# Patient Record
Sex: Male | Born: 1985 | Race: Black or African American
Health system: Northeastern US, Academic
[De-identification: ages and names within clinical notes are randomized; demographics above are authoritative.]

## PROBLEM LIST (undated history)

## (undated) DIAGNOSIS — J45909 Unspecified asthma, uncomplicated: Secondary | ICD-10-CM

## (undated) DIAGNOSIS — E785 Hyperlipidemia, unspecified: Secondary | ICD-10-CM

## (undated) DIAGNOSIS — E7800 Pure hypercholesterolemia, unspecified: Secondary | ICD-10-CM

## (undated) DIAGNOSIS — J069 Acute upper respiratory infection, unspecified: Secondary | ICD-10-CM

## (undated) DIAGNOSIS — K625 Hemorrhage of anus and rectum: Secondary | ICD-10-CM

## (undated) DIAGNOSIS — F419 Anxiety disorder, unspecified: Secondary | ICD-10-CM

## (undated) HISTORY — DX: Unspecified asthma, uncomplicated: J45.909

## (undated) HISTORY — DX: Hyperlipidemia, unspecified: E78.5

## (undated) HISTORY — PX: VASECTOMY: SHX75

## (undated) HISTORY — DX: Acute upper respiratory infection, unspecified: J06.9

## (undated) HISTORY — DX: Hemorrhage of anus and rectum: K62.5

## (undated) HISTORY — PX: WISDOM TOOTH EXTRACTION: SHX21

---

## 2011-06-13 ENCOUNTER — Ambulatory Visit
Admit: 2011-06-13 | Discharge: 2011-06-13 | Disposition: A | Discharge status: Home / Self Care | Payer: Self-pay | Source: Ambulatory Visit | Attending: Emergency Medicine | Admitting: Emergency Medicine

## 2011-08-04 ENCOUNTER — Ambulatory Visit
Admit: 2011-08-04 | Discharge: 2011-08-04 | Disposition: A | Discharge status: Home / Self Care | Payer: Self-pay | Source: Ambulatory Visit | Admitting: Emergency Medicine

## 2011-08-13 ENCOUNTER — Ambulatory Visit
Admit: 2011-08-13 | Discharge: 2011-08-13 | Disposition: A | Discharge status: Home / Self Care | Payer: Self-pay | Source: Ambulatory Visit | Admitting: Emergency Medicine

## 2011-09-19 ENCOUNTER — Ambulatory Visit
Admit: 2011-09-19 | Discharge: 2011-09-19 | Disposition: A | Discharge status: Home / Self Care | Payer: Self-pay | Source: Ambulatory Visit | Admitting: Emergency Medicine

## 2011-09-24 ENCOUNTER — Ambulatory Visit
Admit: 2011-09-24 | Discharge: 2011-09-24 | Disposition: A | Discharge status: Home / Self Care | Payer: Self-pay | Source: Ambulatory Visit | Admitting: Emergency Medicine

## 2012-03-10 ENCOUNTER — Ambulatory Visit
Admit: 2012-03-10 | Discharge: 2012-03-10 | Disposition: A | Discharge status: Home / Self Care | Payer: Self-pay | Source: Ambulatory Visit | Attending: Emergency Medicine | Admitting: Emergency Medicine

## 2012-04-14 ENCOUNTER — Ambulatory Visit
Admit: 2012-04-14 | Discharge: 2012-04-14 | Disposition: A | Discharge status: Home / Self Care | Payer: Self-pay | Source: Ambulatory Visit | Attending: Emergency Medicine | Admitting: Emergency Medicine

## 2015-03-10 ENCOUNTER — Encounter: Payer: Self-pay | Admitting: Emergency Medicine

## 2015-03-10 ENCOUNTER — Inpatient Hospital Stay
Admission: EM | Admit: 2015-03-10 | Disposition: A | Discharge status: Home / Self Care | Payer: Self-pay | Source: Ambulatory Visit | Attending: Orthopedic Surgery | Admitting: Orthopedic Surgery

## 2015-03-10 DIAGNOSIS — W3400XA Accidental discharge from unspecified firearms or gun, initial encounter: Secondary | ICD-10-CM

## 2015-03-10 DIAGNOSIS — T1490XA Injury, unspecified, initial encounter: Principal | ICD-10-CM

## 2015-03-10 LAB — PLASMA PROF 7 (ED ONLY)
Anion Gap,PL: 16 (ref 7–16)
CO2,Plasma: 21 mmol/L (ref 20–28)
Chloride,Plasma: 102 mmol/L (ref 96–108)
Creatinine: 0.87 mg/dL (ref 0.67–1.17)
GFR,Black: 134 *
GFR,Caucasian: 116 *
Glucose,Plasma: 109 mg/dL — ABNORMAL HIGH (ref 60–99)
Potassium,Plasma: 3.8 mmol/L (ref 3.4–4.7)
Sodium,Plasma: 139 mmol/L (ref 133–145)
UN,Plasma: 15 mg/dL (ref 6–20)

## 2015-03-10 LAB — CBC AND DIFFERENTIAL
Baso # K/uL: 0 10*3/uL (ref 0.0–0.1)
Basophil %: 0.5 %
Eos # K/uL: 0.2 10*3/uL (ref 0.0–0.5)
Eosinophil %: 2.2 %
Hematocrit: 36 % — ABNORMAL LOW (ref 40–51)
Hemoglobin: 12.4 g/dL — ABNORMAL LOW (ref 13.7–17.5)
IMM Granulocytes #: 0 10*3/uL (ref 0.0–0.1)
IMM Granulocytes: 0.3 %
Lymph # K/uL: 4.6 10*3/uL — ABNORMAL HIGH (ref 1.3–3.6)
Lymphocyte %: 52.2 %
MCH: 32 pg/cell (ref 26–32)
MCHC: 35 g/dL (ref 32–37)
MCV: 92 fL (ref 79–92)
Mono # K/uL: 0.5 10*3/uL (ref 0.3–0.8)
Monocyte %: 5.6 %
Neut # K/uL: 3.5 10*3/uL (ref 1.8–5.4)
Nucl RBC # K/uL: 0 10*3/uL (ref 0.0–0.0)
Nucl RBC %: 0 /100 WBC (ref 0.0–0.2)
Platelets: 237 10*3/uL (ref 150–330)
RBC: 3.9 MIL/uL — ABNORMAL LOW (ref 4.6–6.1)
RDW: 12.6 % (ref 11.6–14.4)
Seg Neut %: 39.2 %
WBC: 8.8 10*3/uL (ref 4.2–9.1)

## 2015-03-10 LAB — PROTIME-INR
INR: 0.9 — ABNORMAL LOW (ref 1.0–1.2)
Protime: 10.6 s (ref 9.2–12.3)

## 2015-03-10 LAB — RUQ PANEL (ED ONLY)
ALT: 40 U/L (ref 0–50)
AST: 33 U/L (ref 0–50)
Albumin: 4.6 g/dL (ref 3.5–5.2)
Alk Phos: 72 U/L (ref 40–130)
Amylase: 49 U/L (ref 28–100)
Bilirubin,Direct: <0.2 mg/dL (ref 0.0–0.3)
Bilirubin,Total: 0.3 mg/dL (ref 0.0–1.2)
Lipase: 15 U/L (ref 13–60)
Total Protein: 7.3 g/dL (ref 6.3–7.7)

## 2015-03-10 LAB — TEG THROMBELASTOGRAPH
A Angle: 73.1 deg — ABNORMAL HIGH (ref 53.0–73.0)
Coagulation Index: 2.1 (ref <=3.0)
K Time: 1.2 min (ref 1.0–3.0)
LY30: 3.4 % (ref <=7.5)
Maximum Amplitude: 57.4 mm (ref 50.0–72.0)
R Time: 3.4 min — ABNORMAL LOW (ref 4.0–10.0)

## 2015-03-10 LAB — APTT: aPTT: 29 s (ref 25.8–37.9)

## 2015-03-10 LAB — DATE/TIME NOT PROVIDED

## 2015-03-10 LAB — TYPE AND SCREEN
ABO RH Blood Type: O POS
Antibody Screen: NEGATIVE

## 2015-03-10 LAB — ETHANOL: Ethanol: <10 mg/dL

## 2015-03-10 LAB — LACTATE, ARTERIAL, WHOLE BLOOD: Lactate ART,WB: 2.9 mmol/L — ABNORMAL HIGH (ref 0.3–0.8)

## 2015-03-10 LAB — NEUTROPHIL #-INSTRUMENT: Neutrophil #-Instrument: 3.5 10*3/uL

## 2015-03-10 LAB — HOLD GRAY

## 2015-03-10 MED ORDER — CAMPHOR-MENTHOL 0.5-0.5 % EX LOTN *I*
TOPICAL_LOTION | CUTANEOUS | Status: DC | PRN
Start: 2015-03-10 — End: 2015-03-13

## 2015-03-10 MED ORDER — OXYCODONE HCL 5 MG PO TABS *I*
5.0000 mg | ORAL_TABLET | ORAL | Status: DC | PRN
Start: 2015-03-10 — End: 2015-03-13
  Administered 2015-03-12 – 2015-03-13 (×4): 5 mg via ORAL
  Filled 2015-03-10 (×5): qty 1

## 2015-03-10 MED ORDER — CALCIUM CARBONATE ANTACID 500 MG PO CHEW *I*
1000.0000 mg | CHEWABLE_TABLET | Freq: Three times a day (TID) | ORAL | Status: DC | PRN
Start: 2015-03-10 — End: 2015-03-13

## 2015-03-10 MED ORDER — TETANUS-DIPHTH-ACELL PERT 5-2.5-18.5 LF-MCG/0.5 IM SUSP *WRAPPED*
INTRAMUSCULAR | Status: DC
Start: 2015-03-10 — End: 2015-03-10
  Filled 2015-03-10: qty 0.5

## 2015-03-10 MED ORDER — SODIUM CHLORIDE 0.9 % IV BOLUS *I*
Status: AC | PRN
Start: 2015-03-10 — End: 2015-03-10
  Administered 2015-03-10: 1000 mL via INTRAVENOUS

## 2015-03-10 MED ORDER — TETANUS-DIPHTH-ACELL PERT 5-2.5-18.5 LF-MCG/0.5 IM SUSP *WRAPPED*
0.5000 mL | Freq: Once | INTRAMUSCULAR | Status: AC
Start: 2015-03-10 — End: 2015-03-10
  Administered 2015-03-10: 0.5 mL via INTRAMUSCULAR

## 2015-03-10 MED ORDER — OXYCODONE HCL 5 MG PO TABS *I*
10.0000 mg | ORAL_TABLET | ORAL | Status: DC | PRN
Start: 2015-03-10 — End: 2015-03-13
  Administered 2015-03-12: 10 mg via ORAL
  Filled 2015-03-10: qty 2

## 2015-03-10 MED ORDER — CIPROFLOXACIN HCL 500 MG PO TABS *I*
500.0000 mg | ORAL_TABLET | Freq: Two times a day (BID) | ORAL | Status: DC
Start: 2015-03-10 — End: 2015-03-12
  Administered 2015-03-11 (×3): 500 mg via ORAL
  Filled 2015-03-10 (×5): qty 1

## 2015-03-10 MED ORDER — CEFAZOLIN 2000 MG IN 100 ML D5W *I*
2000.0000 mg | INTRAVENOUS | Status: AC
Start: 2015-03-10 — End: 2015-03-12
  Administered 2015-03-12: 2000 mg via INTRAVENOUS

## 2015-03-10 MED ORDER — SODIUM CHLORIDE 0.9 % IV SOLN WRAPPED *I*
100.0000 mL/h | Status: DC
Start: 2015-03-10 — End: 2015-03-11
  Administered 2015-03-11 (×6): 100 mL/h via INTRAVENOUS

## 2015-03-10 MED ORDER — HYDROMORPHONE HCL PF 1 MG/ML IJ SOLN *WRAPPED*
0.5000 mg | INTRAMUSCULAR | Status: DC | PRN
Start: 2015-03-10 — End: 2015-03-13

## 2015-03-10 MED ORDER — ONDANSETRON HCL 2 MG/ML IV SOLN *I*
4.0000 mg | Freq: Four times a day (QID) | INTRAMUSCULAR | Status: DC | PRN
Start: 2015-03-10 — End: 2015-03-13
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
## 2015-03-11 LAB — MRSA (ORSA) AMPLIFICATION: MRSA (ORSA) Amplification: 0

## 2015-03-11 MED ORDER — SODIUM CHLORIDE 0.9 % IV SOLN WRAPPED *I*
100.0000 mL/h | Status: DC
Start: 2015-03-12 — End: 2015-03-13
  Administered 2015-03-12: 100 mL/h via INTRAVENOUS
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
## 2015-03-11 NOTE — ED Notes (Signed)
Pt continues to have some pain in his hip but states that he does not need pain medicine at this time.
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
## 2015-03-11 NOTE — Progress Notes (Signed)
Utilization Management    Level of Care Inpatient as of the date 03/10/2015      Leanah Kolander E Gwyneth Fernandez, RN     Pager: [phone]

## 2015-03-11 NOTE — ED Notes (Signed)
Mother: Veryl SpeakSally Gerald [phone]  Girlfriend: Waylan RocherBrittany Alexander [phone]    Please call when pt can have visitors
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
## 2015-03-12 ENCOUNTER — Encounter
Admission: EM | Disposition: A | Discharge status: Home / Self Care | Payer: Self-pay | Source: Ambulatory Visit | Attending: Orthopedic Surgery

## 2015-03-12 ENCOUNTER — Encounter: Payer: Self-pay | Admitting: Orthopedic Surgery

## 2015-03-12 ENCOUNTER — Other Ambulatory Visit: Payer: Self-pay | Admitting: Orthopedic Surgery

## 2015-03-12 ENCOUNTER — Encounter: Payer: Self-pay | Admitting: Anesthesiology

## 2015-03-12 LAB — BASIC METABOLIC PANEL
Anion Gap: 14 (ref 7–16)
CO2: 21 mmol/L (ref 20–28)
Calcium: 8.6 mg/dL — ABNORMAL LOW (ref 9.0–10.3)
Chloride: 102 mmol/L (ref 96–108)
Creatinine: 0.81 mg/dL (ref 0.67–1.17)
GFR,Black: 138 *
GFR,Caucasian: 119 *
Glucose: 93 mg/dL (ref 60–99)
Lab: 11 mg/dL (ref 6–20)
Potassium: 4.1 mmol/L (ref 3.3–5.1)
Sodium: 137 mmol/L (ref 133–145)

## 2015-03-12 LAB — HCT AND HGB
Hematocrit: 32 % — ABNORMAL LOW (ref 40–51)
Hemoglobin: 11 g/dL — ABNORMAL LOW (ref 13.7–17.5)

## 2015-03-12 LAB — TEG REVIEW

## 2015-03-12 LAB — MCHC: MCHC: 34 g/dL (ref 32–37)

## 2015-03-12 LAB — REVIEWD BY:

## 2015-03-12 LAB — INTERPRETATION, TEG

## 2015-03-12 SURGERY — IRRIGATION AND DEBRIDEMENT, HIP
Anesthesia: General | Site: Hip | Laterality: Left | Wound class: Dirty or Infected

## 2015-03-12 MED ORDER — SODIUM CHLORIDE 0.9 % IR SOLN *I*
Status: DC | PRN
Start: 2015-03-12 — End: 2015-03-12
  Administered 2015-03-12: 9000 mL

## 2015-03-12 MED ORDER — MIDAZOLAM HCL 1 MG/ML IJ SOLN *I* WRAPPED
INTRAMUSCULAR | Status: AC
Start: 2015-03-12 — End: 2015-03-12
  Filled 2015-03-12: qty 2

## 2015-03-12 MED ORDER — PROMETHAZINE HCL 25 MG/ML IJ SOLN *I*
6.2500 mg | Freq: Once | INTRAMUSCULAR | Status: DC | PRN
Start: 2015-03-12 — End: 2015-03-12

## 2015-03-12 MED ORDER — PROPOFOL 10 MG/ML IV EMUL (INTERMITTENT DOSING) WRAPPED *I*
INTRAVENOUS | Status: AC
Start: 2015-03-12 — End: 2015-03-12
  Filled 2015-03-12: qty 20

## 2015-03-12 MED ORDER — SODIUM CHLORIDE 0.9 % IR SOLN *I*
Status: DC | PRN
Start: 2015-03-12 — End: 2015-03-12
  Administered 2015-03-12: 1000 mL

## 2015-03-12 MED ORDER — ONDANSETRON HCL 2 MG/ML IV SOLN *I*
INTRAMUSCULAR | Status: DC | PRN
Start: 2015-03-12 — End: 2015-03-12
  Administered 2015-03-12: 4 mg via INTRAMUSCULAR

## 2015-03-12 MED ORDER — LACTATED RINGERS IV SOLN *I*
INTRAVENOUS | Status: DC | PRN
Start: 2015-03-12 — End: 2015-03-12

## 2015-03-12 MED ORDER — LIDOCAINE HCL 2 % (PF) IJ SOLN *I*
INTRAMUSCULAR | Status: AC
Start: 2015-03-12 — End: 2015-03-12
  Filled 2015-03-12: qty 5

## 2015-03-12 MED ORDER — FENTANYL CITRATE 50 MCG/ML IJ SOLN *WRAPPED*
INTRAMUSCULAR | Status: DC | PRN
Start: 2015-03-12 — End: 2015-03-12
  Administered 2015-03-12 (×2): 100 ug via INTRAVENOUS
  Administered 2015-03-12: 150 ug via INTRAVENOUS
  Administered 2015-03-12: 50 ug via INTRAVENOUS
  Administered 2015-03-12 (×2): 100 ug via INTRAVENOUS

## 2015-03-12 MED ORDER — LIDOCAINE HCL 2 % IJ SOLN *I*
INTRAMUSCULAR | Status: DC | PRN
Start: 2015-03-12 — End: 2015-03-12
  Administered 2015-03-12: 100 mg via INTRAVENOUS

## 2015-03-12 MED ORDER — SUGAMMADEX SODIUM 100 MG/1ML IV SOLN *WRAPPED*
INTRAVENOUS | Status: DC | PRN
Start: 2015-03-12 — End: 2015-03-12
  Administered 2015-03-12: 200 mg via INTRAVENOUS

## 2015-03-12 MED ORDER — DEXAMETHASONE SODIUM PHOSPHATE 4 MG/ML INJ SOLN *WRAPPED*
INTRAMUSCULAR | Status: AC
Start: 2015-03-12 — End: 2015-03-12
  Filled 2015-03-12: qty 1

## 2015-03-12 MED ORDER — HALOPERIDOL LACTATE 5 MG/ML IJ SOLN *I*
0.5000 mg | Freq: Once | INTRAMUSCULAR | Status: DC | PRN
Start: 2015-03-12 — End: 2015-03-12

## 2015-03-12 MED ORDER — BISACODYL 10 MG RE SUPP *I*
10.0000 mg | Freq: Every day | RECTAL | Status: DC | PRN
Start: 2015-03-12 — End: 2015-03-13

## 2015-03-12 MED ORDER — HYDROMORPHONE HCL 2 MG/ML IJ SOLN *WRAPPED*
0.4000 mg | INTRAMUSCULAR | Status: DC | PRN
Start: 2015-03-12 — End: 2015-03-12
  Administered 2015-03-12 (×2): 0.4 mg via INTRAVENOUS

## 2015-03-12 MED ORDER — DEXAMETHASONE SODIUM PHOSPHATE 4 MG/ML INJ SOLN *WRAPPED*
INTRAMUSCULAR | Status: DC | PRN
Start: 2015-03-12 — End: 2015-03-12
  Administered 2015-03-12: 4 mg via INTRAVENOUS

## 2015-03-12 MED ORDER — SUGAMMADEX SODIUM 100 MG/1ML IV SOLN *WRAPPED*
INTRAVENOUS | Status: AC
Start: 2015-03-12 — End: 2015-03-12
  Filled 2015-03-12: qty 2

## 2015-03-12 MED ORDER — HYDROMORPHONE HCL 2 MG/ML IJ SOLN *WRAPPED*
INTRAMUSCULAR | Status: AC
Start: 2015-03-12 — End: 2015-03-12
  Filled 2015-03-12: qty 1

## 2015-03-12 MED ORDER — FENTANYL CITRATE 50 MCG/ML IJ SOLN *WRAPPED*
INTRAMUSCULAR | Status: AC
Start: 2015-03-12 — End: 2015-03-12
  Filled 2015-03-12: qty 5

## 2015-03-12 MED ORDER — DOCUSATE SODIUM 100 MG PO CAPS *I*
200.0000 mg | ORAL_CAPSULE | Freq: Every evening | ORAL | Status: DC
Start: 2015-03-12 — End: 2015-03-13
  Administered 2015-03-12: 200 mg via ORAL
  Filled 2015-03-12: qty 2

## 2015-03-12 MED ORDER — SENNOSIDES 8.6 MG PO TABS *I*
2.0000 | ORAL_TABLET | Freq: Every day | ORAL | Status: DC
Start: 2015-03-12 — End: 2015-03-13
  Administered 2015-03-12 – 2015-03-13 (×2): 2 via ORAL
  Filled 2015-03-12: qty 2

## 2015-03-12 MED ORDER — ROCURONIUM BROMIDE 10 MG/ML IV SOLN *WRAPPED*
Status: AC
Start: 2015-03-12 — End: 2015-03-12
  Filled 2015-03-12: qty 5

## 2015-03-12 MED ORDER — NALOXONE HCL 0.4 MG/ML IJ SOLN *WRAPPED*
0.1000 mg | Status: DC | PRN
Start: 2015-03-12 — End: 2015-03-13

## 2015-03-12 MED ORDER — FENTANYL CITRATE 50 MCG/ML IJ SOLN *WRAPPED*
INTRAMUSCULAR | Status: DC | PRN
Start: 2015-03-12 — End: 2015-03-12

## 2015-03-12 MED ORDER — PROPOFOL 10 MG/ML IV EMUL (INTERMITTENT DOSING) WRAPPED *I*
INTRAVENOUS | Status: DC | PRN
Start: 2015-03-12 — End: 2015-03-12
  Administered 2015-03-12: 100 mg via INTRAVENOUS
  Administered 2015-03-12: 200 mg via INTRAVENOUS

## 2015-03-12 MED ORDER — CEFAZOLIN 2000 MG IN 100 ML D5W *I*
2000.0000 mg | Freq: Three times a day (TID) | INTRAVENOUS | Status: AC
Start: 2015-03-12 — End: 2015-03-13
  Administered 2015-03-12 – 2015-03-13 (×2): 2000 mg via INTRAVENOUS
  Filled 2015-03-12 (×2): qty 1

## 2015-03-12 MED ORDER — ONDANSETRON HCL 2 MG/ML IV SOLN *I*
INTRAMUSCULAR | Status: AC
Start: 2015-03-12 — End: 2015-03-12
  Filled 2015-03-12: qty 2

## 2015-03-12 MED ORDER — ROCURONIUM BROMIDE 10 MG/ML IV SOLN *WRAPPED*
Status: DC | PRN
Start: 2015-03-12 — End: 2015-03-12
  Administered 2015-03-12: 10 mg via INTRAVENOUS
  Administered 2015-03-12: 50 mg via INTRAVENOUS
  Administered 2015-03-12: 10 mg via INTRAVENOUS

## 2015-03-12 MED ORDER — SODIUM CHLORIDE 0.9 % IV SOLN WRAPPED *I*
100.0000 mL/h | Status: DC
Start: 2015-03-12 — End: 2015-03-13
  Administered 2015-03-12: 100 mL/h via INTRAVENOUS

## 2015-03-12 MED ORDER — FENTANYL CITRATE 50 MCG/ML IJ SOLN *WRAPPED*
INTRAMUSCULAR | Status: AC
Start: 2015-03-12 — End: 2015-03-12
  Filled 2015-03-12: qty 2

## 2015-03-12 MED ORDER — MIDAZOLAM HCL 1 MG/ML IJ SOLN *I* WRAPPED
INTRAMUSCULAR | Status: DC | PRN
Start: 2015-03-12 — End: 2015-03-12
  Administered 2015-03-12: 2 mg via INTRAVENOUS

## 2015-03-12 MED ORDER — HYDROMORPHONE HCL PF 1 MG/ML IJ SOLN *WRAPPED*
0.5000 mg | INTRAMUSCULAR | Status: DC | PRN
Start: 2015-03-12 — End: 2015-03-13

## 2015-03-12 MED ORDER — ACETAMINOPHEN 500 MG PO TABS *I*
1000.0000 mg | ORAL_TABLET | Freq: Three times a day (TID) | ORAL | Status: DC
Start: 2015-03-12 — End: 2015-03-13
  Administered 2015-03-12 – 2015-03-13 (×3): 1000 mg via ORAL
  Filled 2015-03-12 (×3): qty 2

## 2015-03-12 SURGICAL SUPPLY — 32 items
APPLICATOR CHLORAPREP 26ML ORANGE LARGE (Solution) ×12 IMPLANT
BANDAGE ESMARK 4IN LF STER USE 219460 (Dressing) IMPLANT
BLADE ELECTRODE COATED 2.5IN (Supply) ×3 IMPLANT
DRAPE EXTREMITY T (Drape)
DRAPE HAND (Drape)
DRAPE SPLIT 77X120 (Drape)
DRAPE SUR 121X90X128IN 5 LAYR EXT SMS POLYPR T ABSRB REINF CIR E FEN HK LOOP LN HLDR DISP (Drape) IMPLANT
DRAPE SUR 75X110X146IN 5 LAYR HND SMS POLYPR ABSRB REINF CIR E FEN CNTOUR TBL EXTN HK LOOP (Drape) IMPLANT
DRAPE SUR W77XL120IN SMS SPL W/ ADH DISP (Drape) IMPLANT
DRAPE SURG IOBAN 2 ISOLATION LG (Drape) ×2 IMPLANT
DRAPE U-SHEET SPLIT PLASTIC STERIL (Drape) ×3 IMPLANT
DRESSING COBAN STERILE 4IN (Dressing) ×3 IMPLANT
DRESSING FLUFF SUPER SP 6 X 6.75IN STER (Dressing) ×2 IMPLANT
DRESSING NON-ADHERENT 3 X 8IN STER (Dressing) ×2 IMPLANT
DRESSING NON-ADHERENT SGL 3 X 8IN STER (Dressing) ×3 IMPLANT
GLOVE LINER BIOGEL INDICATOR SZ7.5 LTX (Glove) ×5 IMPLANT
GLOVE SURG BIOGEL SZ7.5 LTX STER PF (Glove) ×5 IMPLANT
PACK CUSTOM DOUBLE BASIN (Pack) ×1 IMPLANT
PACK CUSTOM ORTHO EXTREMITY CDS (Pack) ×3 IMPLANT
PACK TOWEL ORTHO LIGHT BLUE STER (Supply) ×8 IMPLANT
SET TBNG GRAVITY FOUR-SPIKE (Supply) ×3 IMPLANT
SOL SOD CHL IRRIG .9PCT 3000ML BAG (Solution) ×9 IMPLANT
SOL SOD CHL IRRIG 1500ML BTL (Solution) ×3 IMPLANT
STAPLER SKIN 35W NL (Supply) ×3
STAPLER SKIN WIDE STPL LEG L3.9MM WIRE DIA0.58MM FIX HD PROX (Supply) ×1 IMPLANT
SUTR CHROMIC GUT 2-0 CT-1 27IN (Suture) ×4 IMPLANT
SUTR ETHILON MONO 3-0 PS-1 BLACK (Suture) ×6 IMPLANT
SUTR PDS II 0 CT-1 18IN VIOLET (Suture) ×4 IMPLANT
SUTR VICRYL ANTIB BRD 2-0 CP-2 18 UNDYED (Suture) ×2 IMPLANT
SUTR VICRYL PLUS 0 CT-1 8-18 UNDY (Suture) ×2 IMPLANT
SUTURE PDS II SZ 0 L27IN ABSRB VLT L36MM CT-1 1/2 CIR TAPERPOINT NDL POLYDIOXANONE MFIL (Suture) ×2 IMPLANT
TAPE HYPAFIX MEDIPORE 4IN X 10YD (Dressing) ×2 IMPLANT

## 2015-03-12 NOTE — Anesthesia Preprocedure Evaluation (Addendum)
Anesthesia Pre-operative History and Physical for Michael May    ______________________________________________________________________________________  CPM Assessment Not Completed  <[facility]MCANSURGSITE>  Anesthesia Evaluation Information Source: patient, family, records     ANESTHESIA  Pertinent(-):  history of anesthetic complications, Family Hx of Anesthetic Complications    GENERAL  Pertinent (-):  history of anesthetic complications, Family Hx of Anesthetic Complications    HEENT  Pertinent (-):   neck pain PULMONARY    + Smoker            currently, THC  Pertinent(-): asthma, recent URI, COPD    CARDIOVASCULAR  Good(4+METs) Exercise Tolerance  Pertinent(-):  hypertension, past MI, dysrhythmias    GI/HEPATIC/RENAL  Last PO Intake: >8hr before procedure and >2hr before procedure (clears)  Pertinent(-):  GERD, nausea, vomiting, liver  issues, renal issues NEURO/PSYCH  Pertinent(-):  seizures, cerebrovascular event    ENDO/OTHER  Pertinent(-):  diabetes mellitus, thyroid disease    HEMALOGIC  Pertinent(-):  bruises/bleeds easily, coagulopathy       Physical Exam    Airway            Mouth opening: normal            Mallampati: I            TM distance (fb): >3 FB            Neck ROM: full            Airway Impression: easy  Dental   Normal Exam   Cardiovascular           Rhythm: regular           Rate: normal  No murmur       Pulmonary     breath sounds clear to auscultation    No cough, rhonchi    Mental Status     Not confused or anxious       ________________________________________________________________________  Plan  ASA Score  2  Anesthetic Plan general    Induction (routine IV); General Anesthesia/Sedation Maintenance Plan (inhaled agents);  Airway Manipulation (direct laryngoscopy); Airway (cuffed ETT); Line ( use current access); Monitoring (standard ASA); Positioning (supine); PONV Plan (ondansetron); Pain (per surgical team); PostOp (PACU)    Informed Consent     Risks:          Risks discussed were  commensurate with the plan listed above with the following specific points: N/V, aspiration and sore throat , damage to:(eyes, nerves, teeth), awareness, unexpected serious injury, allergic Rx    Anesthetic Consent:      Anesthetic plan (and risks as noted above) were discussed with patient    Plan also discussed with team members including:  surgeon and CRNA    Attending Attestation:  As the primary attending anesthesiologist, I attest that the patient or proxy understands and accepts the risks and benefits of the anesthesia plan. I also attest that I have personally performed a pre-anesthetic examination and evaluation, and prescribed the anesthetic plan for this particular location within 48 hours prior to the anesthetic as documented.
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
## 2015-03-12 NOTE — Anesthesia Procedure Notes (Signed)
---------------------------------------------------------------------------------------------------------------------------------------    AIRWAY   GENERAL INFORMATION AND STAFF    Patient location during procedure: OR       Date of Procedure: 03/12/2015 10:35 AM  CONDITION PRIOR TO MANIPULATION     Current Airway/Neck Condition:  Normal        For more airway physical exam details, see Anesthesia PreOp Evaluation  AIRWAY METHOD     Patient Position:  Sniffing    Preoxygenated: yes      Induction: IV    Mask Difficulty Assessment:  1 - vent by mask       Mask NMB: 1 - vent by mask      Technique Used for Successful ETT Placement:  Direct laryngoscopy    Blade Type:  Macintosh    Laryngoscope Blade/Video laryngoscope Blade Size:  4    Cormack-Lehane Classification:  Grade I - full view of glottis    Placement Verified by: capnometry, auscultation and palpation of cuff      Number of Attempts at Approach:  1    Number of Other Approaches Attempted:  0  FINAL AIRWAY DETAILS    Final Airway Type:  Endotracheal airway    Adjunct Airway: oropharyngeal airway (OPA)    OPA Size:  90mm    Final Endotracheal Airway:  ETT      Cuffed: cuffed    Insertion Site:  Oral    ETT Size (mm):  7.5    Cuff Volume (mL):  5    Distance inserted from Lips (cm):  21  ----------------------------------------------------------------------------------------------------------------------------------------
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
## 2015-03-12 NOTE — Anesthesia Case Conclusion (Signed)
CASE CONCLUSION  Emergence  Actions:  Suctioned, OPA and extubated  Criteria Used for Airway Removal:  Adequate Tv & RR, acceptable O2 saturation and following commands  Assessment:  Routine  Transport  Directly to: PACU  Airway:  Nasal cannula  Oxygen Delivery:  2 lpm  Position:  Recumbent  Patient Condition on Handoff  Level of Consciousness:  Mildly sedated  Patient Condition:  Stable  Handoff Report to:  RN
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
## 2015-03-13 LAB — HEMATOCRIT: Hematocrit: 32 % — ABNORMAL LOW (ref 40–51)

## 2015-03-13 LAB — MCHC: MCHC: 36 g/dL (ref 32–37)

## 2015-03-13 MED ORDER — ACETAMINOPHEN 500 MG PO TABS *I*
1000.0000 mg | ORAL_TABLET | Freq: Three times a day (TID) | ORAL | 0 refills | Status: AC | PRN
Start: 2015-03-13 — End: ?

## 2015-03-13 MED ORDER — ENOXAPARIN SODIUM 40 MG/0.4ML IJ SOSY *I*
40.0000 mg | PREFILLED_SYRINGE | Freq: Every day | INTRAMUSCULAR | 0 refills | Status: AC
Start: 2015-03-13 — End: 2015-03-22

## 2015-03-13 MED ORDER — DOCUSATE SODIUM 100 MG PO CAPS *I*
200.0000 mg | ORAL_CAPSULE | Freq: Every evening | ORAL | 0 refills | Status: AC
Start: 2015-03-13 — End: ?

## 2015-03-13 MED ORDER — ENOXAPARIN SODIUM 40 MG/0.4ML IJ SOSY *I*
40.0000 mg | PREFILLED_SYRINGE | Freq: Every day | INTRAMUSCULAR | Status: DC
Start: 2015-03-13 — End: 2015-03-13
  Administered 2015-03-13: 40 mg via SUBCUTANEOUS
  Filled 2015-03-13: qty 0.4

## 2015-03-13 MED ORDER — SENNOSIDES 8.6 MG PO TABS *I*
2.0000 | ORAL_TABLET | Freq: Every day | ORAL | 0 refills | Status: AC
Start: 2015-03-13 — End: ?

## 2015-03-13 MED ORDER — OXYCODONE HCL 5 MG PO TABS *I*
5.0000 mg | ORAL_TABLET | ORAL | 0 refills | Status: DC | PRN
Start: 2015-03-13 — End: 2015-03-22

## 2015-03-13 MED ORDER — ASPIRIN 325 MG PO TBEC *I*
325.0000 mg | DELAYED_RELEASE_TABLET | Freq: Two times a day (BID) | ORAL | 0 refills | Status: AC
Start: 2015-03-22 — End: 2015-04-19

## 2015-03-13 NOTE — Progress Notes (Signed)
Crutches ordered from [facility] to be delivered to hospital for discharge today.     Medications [facility] to be delivered to bedside.     No further discharge planning indicated at this time.

## 2015-03-13 NOTE — Addendum Note (Signed)
Addendum  created 03/13/15 1515 by Shirlean MylarWhitney, Nickolaos Brallier, CRNA    Sign clinical note
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
## 2015-03-13 NOTE — Plan of Care (Signed)
Problem: Impaired Ambulation  Goal: STG - IMPROVE AMBULATION  Outcome: Completed or Resolved Date Met:  03/13/15  Patient will ambulate 100-149 feet using crutches with Modified independence     Time frame: 1 Visit
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
## 2015-03-22 ENCOUNTER — Encounter: Payer: Self-pay | Admitting: Orthopedic Surgery

## 2015-03-22 ENCOUNTER — Ambulatory Visit: Payer: Self-pay | Admitting: Orthopedic Surgery

## 2015-03-22 VITALS — BP 125/81 | HR 77 | Ht 71.0 in | Wt 190.0 lb

## 2015-03-22 DIAGNOSIS — S72009A Fracture of unspecified part of neck of unspecified femur, initial encounter for closed fracture: Secondary | ICD-10-CM

## 2015-03-22 MED ORDER — OXYCODONE HCL 5 MG PO TABS *I*
5.0000 mg | ORAL_TABLET | Freq: Two times a day (BID) | ORAL | 0 refills | Status: AC | PRN
Start: 2015-03-22 — End: ?
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
## 2015-04-04 ENCOUNTER — Encounter: Payer: Self-pay | Admitting: Orthopedic Surgery

## 2015-04-08 ENCOUNTER — Encounter (HOSPITAL_COMMUNITY): Payer: Self-pay | Admitting: Emergency Medicine

## 2015-04-08 ENCOUNTER — Emergency Department (HOSPITAL_COMMUNITY): Payer: Managed Care, Other (non HMO)

## 2015-04-08 ENCOUNTER — Emergency Department (HOSPITAL_COMMUNITY)
Admission: EM | Admit: 2015-04-08 | Discharge: 2015-04-08 | Disposition: A | Discharge status: Home / Self Care | Payer: Managed Care, Other (non HMO) | Attending: Emergency Medicine | Admitting: Emergency Medicine

## 2015-04-08 DIAGNOSIS — R079 Chest pain, unspecified: Secondary | ICD-10-CM | POA: Diagnosis present

## 2015-04-08 DIAGNOSIS — Z8659 Personal history of other mental and behavioral disorders: Secondary | ICD-10-CM | POA: Insufficient documentation

## 2015-04-08 DIAGNOSIS — R05 Cough: Secondary | ICD-10-CM | POA: Diagnosis not present

## 2015-04-08 DIAGNOSIS — R0789 Other chest pain: Secondary | ICD-10-CM

## 2015-04-08 DIAGNOSIS — Z8639 Personal history of other endocrine, nutritional and metabolic disease: Secondary | ICD-10-CM | POA: Diagnosis not present

## 2015-04-08 HISTORY — DX: Pure hypercholesterolemia, unspecified: E78.00

## 2015-04-08 HISTORY — DX: Anxiety disorder, unspecified: F41.9

## 2015-04-08 LAB — CBC
HCT: 42.6 % (ref 39.0–52.0)
Hemoglobin: 15.4 g/dL (ref 13.0–17.0)
MCH: 31.2 pg (ref 26.0–34.0)
MCHC: 36.2 g/dL — ABNORMAL HIGH (ref 30.0–36.0)
MCV: 86.2 fL (ref 78.0–100.0)
PLATELETS: 231 10*3/uL (ref 150–400)
RBC: 4.94 MIL/uL (ref 4.22–5.81)
RDW: 12.7 % (ref 11.5–15.5)
WBC: 6.4 10*3/uL (ref 4.0–10.5)

## 2015-04-08 LAB — BASIC METABOLIC PANEL
Anion gap: 5 (ref 5–15)
BUN: 14 mg/dL (ref 6–20)
CALCIUM: 9.3 mg/dL (ref 8.9–10.3)
CO2: 28 mmol/L (ref 22–32)
CREATININE: 0.95 mg/dL (ref 0.61–1.24)
Chloride: 106 mmol/L (ref 101–111)
GFR calc non Af Amer: >60 mL/min (ref >60)
Glucose, Bld: 121 mg/dL — ABNORMAL HIGH (ref 65–99)
Potassium: 3.7 mmol/L (ref 3.5–5.1)
SODIUM: 139 mmol/L (ref 135–145)

## 2015-04-08 LAB — I-STAT TROPONIN, ED: TROPONIN I, POC: 0 ng/mL (ref 0.00–0.08)

## 2015-04-08 NOTE — ED Notes (Signed)
Pt currently talking on the phone, states we can hook him make up to the cardiac monitor when he is done talking.

## 2015-04-08 NOTE — ED Provider Notes (Signed)
CSN: [id number]     Arrival date & time 04/08/15  1541 History   First MD Initiated Contact with Patient 04/08/15 1613     Chief Complaint  Patient presents with  . Chest Pain  . Cough     (Consider location/radiation/quality/duration/timing/severity/associated sxs/prior Treatment) HPI   29 year old nonsmoker male with history of anxiety and high cholesterol presenting for evaluation of chest pain. Patient states for the past week he has had URI symptoms with runny nose sneezing occasional coughing with yellow sputum. This morning while eating breakfast he developed acute onset of nonexertional midsternal chest pain. Describe pain as a sharp shooting sensation with some burning sensation lasting for approximately 10-15 minutes. He did feel lightheadedness during that episode and was concerned. He presented to the ED today to "make sure have a heart attack". Report having occasional similar episodes of chest pain when he is anxious. Denies any strong family history history of premature cardiac death denies any chest injury, diaphoresis, hemoptysis, shortness of breath, abdominal pain, or rash. His pain has completely resolved without any specific treatment. He denies any increasing stress. Denies any history of heartburn.  Past Medical History  Diagnosis Date  . Anxiety   . High cholesterol    Past Surgical History  Procedure Laterality Date  . Vasectomy    . Wisdom tooth extraction     No family history on file. Social History  Substance Use Topics  . Smoking status: Never Smoker   . Smokeless tobacco: None  . Alcohol Use: Yes     Comment: occasional     Review of Systems  All other systems reviewed and are negative.     Allergies  Review of patient's allergies indicates no known allergies.  Home Medications   Prior to Admission medications   Not on File   BP 126/76 mmHg  Pulse 68  Temp(Src) 98 F (36.7 C) (Oral)  Resp 20  SpO2 100% Physical Exam   Constitutional: He appears well-developed and well-nourished. No distress.  HENT:  Head: Atraumatic.  Eyes: Conjunctivae are normal.  Neck: Neck supple.  Cardiovascular: Normal rate, regular rhythm and intact distal pulses.   Pulmonary/Chest: Effort normal and breath sounds normal. He exhibits no tenderness.  Abdominal: Soft. There is no tenderness.  Musculoskeletal: He exhibits no edema.  Neurological: He is alert.  Skin: No rash noted.  Psychiatric: He has a normal mood and affect.  Nursing note and vitals reviewed.   ED Course  Procedures (including critical care time)   Patient presents with chest pain atypical for ACS. No SOB, and is PERC negative.  Doubt PE.  Hx of anxiety, but does not appear anxious.  Suspect gastritis/gerd as pain started after eating.  No active pain at this time.    Labs Review Labs Reviewed  CBC - Abnormal; Notable for the following:    MCHC 36.2 (*)    All other components within normal limits  BASIC METABOLIC PANEL  I-STAT TROPOININ, ED    Imaging Review Dg Chest 2 View  04/08/2015  CLINICAL DATA:  Chest pain EXAM: CHEST  2 VIEW COMPARISON:  None. FINDINGS: Normal heart size. Normal mediastinal contour. No pneumothorax. No pleural effusion. Clear lungs, with no focal lung consolidation and no pulmonary edema. IMPRESSION: No active cardiopulmonary disease. Electronically Signed   By: Delbert Phenix M.D.   On: 04/08/2015 16:30   I have personally reviewed and evaluated these images and lab results as part of my medical decision-making.   EKG Interpretation  None      Date: 04/08/2015  Rate: 70  Rhythm: normal sinus rhythm  QRS Axis: normal  Intervals: normal  ST/T Wave abnormalities: normal  Conduction Disutrbances: none  Narrative Interpretation:   Old EKG Reviewed: No significant changes noted     MDM   Final diagnoses:  Atypical chest pain    BP 126/76 mmHg  Pulse 68  Temp(Src) 98 F (36.7 C) (Oral)  Resp 20  SpO2  100%     Fayrene HelperBowie Belma Dyches, PA-C 04/11/15 0600  Tilden FossaElizabeth Rees, MD 04/16/15 [phone]

## 2015-04-08 NOTE — Discharge Instructions (Signed)
Your chest pain is not likely related to a heart attack.  Follow up with your doctor for further evaluation.  Return if your condition worsen or if you have other concerns.   Chest Pain Observation It is often hard to give a specific diagnosis for the cause of chest pain. Among other possibilities your symptoms might be caused by inadequate oxygen delivery to your heart (angina). Angina that is not treated or evaluated can lead to a heart attack (myocardial infarction) or death. Blood tests, electrocardiograms, and X-rays may have been done to help determine a possible cause of your chest pain. After evaluation and observation, your health care provider has determined that it is unlikely your pain was caused by an unstable condition that requires hospitalization. However, a full evaluation of your pain may need to be completed, with additional diagnostic testing as directed. It is very important to keep your follow-up appointments. Not keeping your follow-up appointments could result in permanent heart damage, disability, or death. If there is any problem keeping your follow-up appointments, you must call your health care provider. HOME CARE INSTRUCTIONS  Due to the slight chance that your pain could be angina, it is important to follow your health care provider's treatment plan and also maintain a healthy lifestyle:  Maintain or work toward achieving a healthy weight.  Stay physically active and exercise regularly.  Decrease your salt intake.  Eat a balanced, healthy diet. Talk to a dietitian to learn about heart-healthy foods.  Increase your fiber intake by including whole grains, vegetables, fruits, and nuts in your diet.  Avoid situations that cause stress, anger, or depression.  Take medicines as advised by your health care provider. Report any side effects to your health care provider. Do not stop medicines or adjust the dosages on your own.  Quit smoking. Do not use nicotine patches or  gum until you check with your health care provider.  Keep your blood pressure, blood sugar, and cholesterol levels within normal limits.  Limit alcohol intake to no more than 1 drink per day for women who are not pregnant and 2 drinks per day for men.  Do not abuse drugs. SEEK IMMEDIATE MEDICAL CARE IF: You have severe chest pain or pressure which may include symptoms such as:  You feel pain or pressure in your arms, neck, jaw, or back.  You have severe back or abdominal pain, feel sick to your stomach (nauseous), or throw up (vomit).  You are sweating profusely.  You are having a fast or irregular heartbeat.  You feel short of breath while at rest.  You notice increasing shortness of breath during rest, sleep, or with activity.  You have chest pain that does not get better after rest or after taking your usual medicine.  You wake from sleep with chest pain.  You are unable to sleep because you cannot breathe.  You develop a frequent cough or you are coughing up blood.  You feel dizzy, faint, or experience extreme fatigue.  You develop severe weakness, dizziness, fainting, or chills. Any of these symptoms may represent a serious problem that is an emergency. Do not wait to see if the symptoms will go away. Call your local emergency services (911 in the [place] Do not drive yourself to the hospital. MAKE SURE YOU:  Understand these instructions.  Will watch your condition.  Will get help right away if you are not doing well or get worse.   This information is not intended to replace advice given  to you by your health care provider. Make sure you discuss any questions you have with your health care provider.   Document Released: 06/07/2010 Document Revised: 05/10/2013 Document Reviewed: 11/04/2012 Elsevier Interactive Patient Education [employer].

## 2015-04-08 NOTE — ED Notes (Signed)
Pt states that today started having central chest pain. Wasn't for sure if "anxiety related or if i was having a heart attack. i do have anxiety and sometimes have attacks".  Pt adds that his anxiety meds havent been working.  Pt has had productive cough x couple days that is "thick clearish".

## 2015-04-08 NOTE — ED Notes (Signed)
Patient was alert, oriented and stable upon discharge. RN went over AVS and patient had no further questions.  

## 2015-04-18 ENCOUNTER — Other Ambulatory Visit: Payer: Self-pay | Admitting: Orthopedic Surgery

## 2015-04-18 DIAGNOSIS — S72009A Fracture of unspecified part of neck of unspecified femur, initial encounter for closed fracture: Secondary | ICD-10-CM

## 2015-04-19 ENCOUNTER — Ambulatory Visit: Payer: Self-pay | Admitting: Orthopedic Surgery

## 2015-04-26 ENCOUNTER — Encounter: Payer: Self-pay | Admitting: Orthopedic Surgery

## 2016-06-29 ENCOUNTER — Encounter (HOSPITAL_COMMUNITY): Payer: Self-pay | Admitting: Emergency Medicine

## 2016-06-29 ENCOUNTER — Other Ambulatory Visit: Payer: Self-pay

## 2016-06-29 ENCOUNTER — Emergency Department (HOSPITAL_COMMUNITY)
Admission: EM | Admit: 2016-06-29 | Discharge: 2016-06-29 | Disposition: A | Discharge status: Home / Self Care | Payer: Managed Care, Other (non HMO) | Attending: Emergency Medicine | Admitting: Emergency Medicine

## 2016-06-29 DIAGNOSIS — Z79899 Other long term (current) drug therapy: Secondary | ICD-10-CM | POA: Insufficient documentation

## 2016-06-29 DIAGNOSIS — R55 Syncope and collapse: Secondary | ICD-10-CM

## 2016-06-29 LAB — CBC
HCT: 44.3 % (ref 39.0–52.0)
Hemoglobin: 15.6 g/dL (ref 13.0–17.0)
MCH: 30.7 pg (ref 26.0–34.0)
MCHC: 35.2 g/dL (ref 30.0–36.0)
MCV: 87.2 fL (ref 78.0–100.0)
Platelets: 219 10*3/uL (ref 150–400)
RBC: 5.08 MIL/uL (ref 4.22–5.81)
RDW: 12.9 % (ref 11.5–15.5)
WBC: 10 10*3/uL (ref 4.0–10.5)

## 2016-06-29 LAB — BASIC METABOLIC PANEL
Anion gap: 6 (ref 5–15)
BUN: 9 mg/dL (ref 6–20)
CHLORIDE: 105 mmol/L (ref 101–111)
CO2: 27 mmol/L (ref 22–32)
CREATININE: 0.96 mg/dL (ref 0.61–1.24)
Calcium: 9.3 mg/dL (ref 8.9–10.3)
GFR calc non Af Amer: >60 mL/min (ref >60)
Glucose, Bld: 143 mg/dL — ABNORMAL HIGH (ref 65–99)
POTASSIUM: 4.1 mmol/L (ref 3.5–5.1)
SODIUM: 138 mmol/L (ref 135–145)

## 2016-06-29 LAB — URINALYSIS, ROUTINE W REFLEX MICROSCOPIC
Bilirubin Urine: NEGATIVE
Glucose, UA: NEGATIVE mg/dL
Hgb urine dipstick: NEGATIVE
Ketones, ur: NEGATIVE mg/dL
LEUKOCYTES UA: NEGATIVE
Nitrite: NEGATIVE
PROTEIN: NEGATIVE mg/dL
Specific Gravity, Urine: 1.02 (ref 1.005–1.030)
pH: 6 (ref 5.0–8.0)
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
## 2016-06-29 NOTE — Discharge Instructions (Signed)
Follow these instructions at home: Learn to identify the signs that an episode is coming. Sit or lie down at the first sign of a fainting spell. If you sit down, put your head down between your legs. If you lie down, swing your legs up in the air to increase blood flow to the brain. Avoid hot tubs and saunas. Avoid standing for a long time. If you have to stand for a long time, try: Crossing your legs. Flexing and stretching your leg muscles. Squatting. Moving your legs. Bending over. Drink enough fluid to keep your urine clear or pale yellow. Make changes to your diet that your health care provider recommends. You may be told to: Avoid caffeine. Eat more salt. Take over-the-counter and prescription medicines only as told by your health care provider. Contact a health care provider if: You continue to have fainting spells despite treatment. You faint more often despite treatment. You lose consciousness for more than a few minutes. You faint during or after exercising or after being startled. You have twitching or jerky movements for longer than a few seconds during a fainting spell. You have an episode of twitching or jerky movements without fainting. Get help right away if: A fainting spell leads to an injury or bleeding. You have new symptoms that occur with the fainting spells, such as: Shortness of breath. Chest pain. Irregular heartbeat. You twitch or make jerky movements for more than 5 minutes. You twitch or make jerky movements during more than one fainting spell.

## 2016-08-30 IMAGING — CR DG CHEST 2V
2 series · 2 of 2 positions shown · non-contrast
Comparison: None.

CLINICAL DATA: Chest pain

EXAM:
CHEST  2 VIEW

[w chest pa]
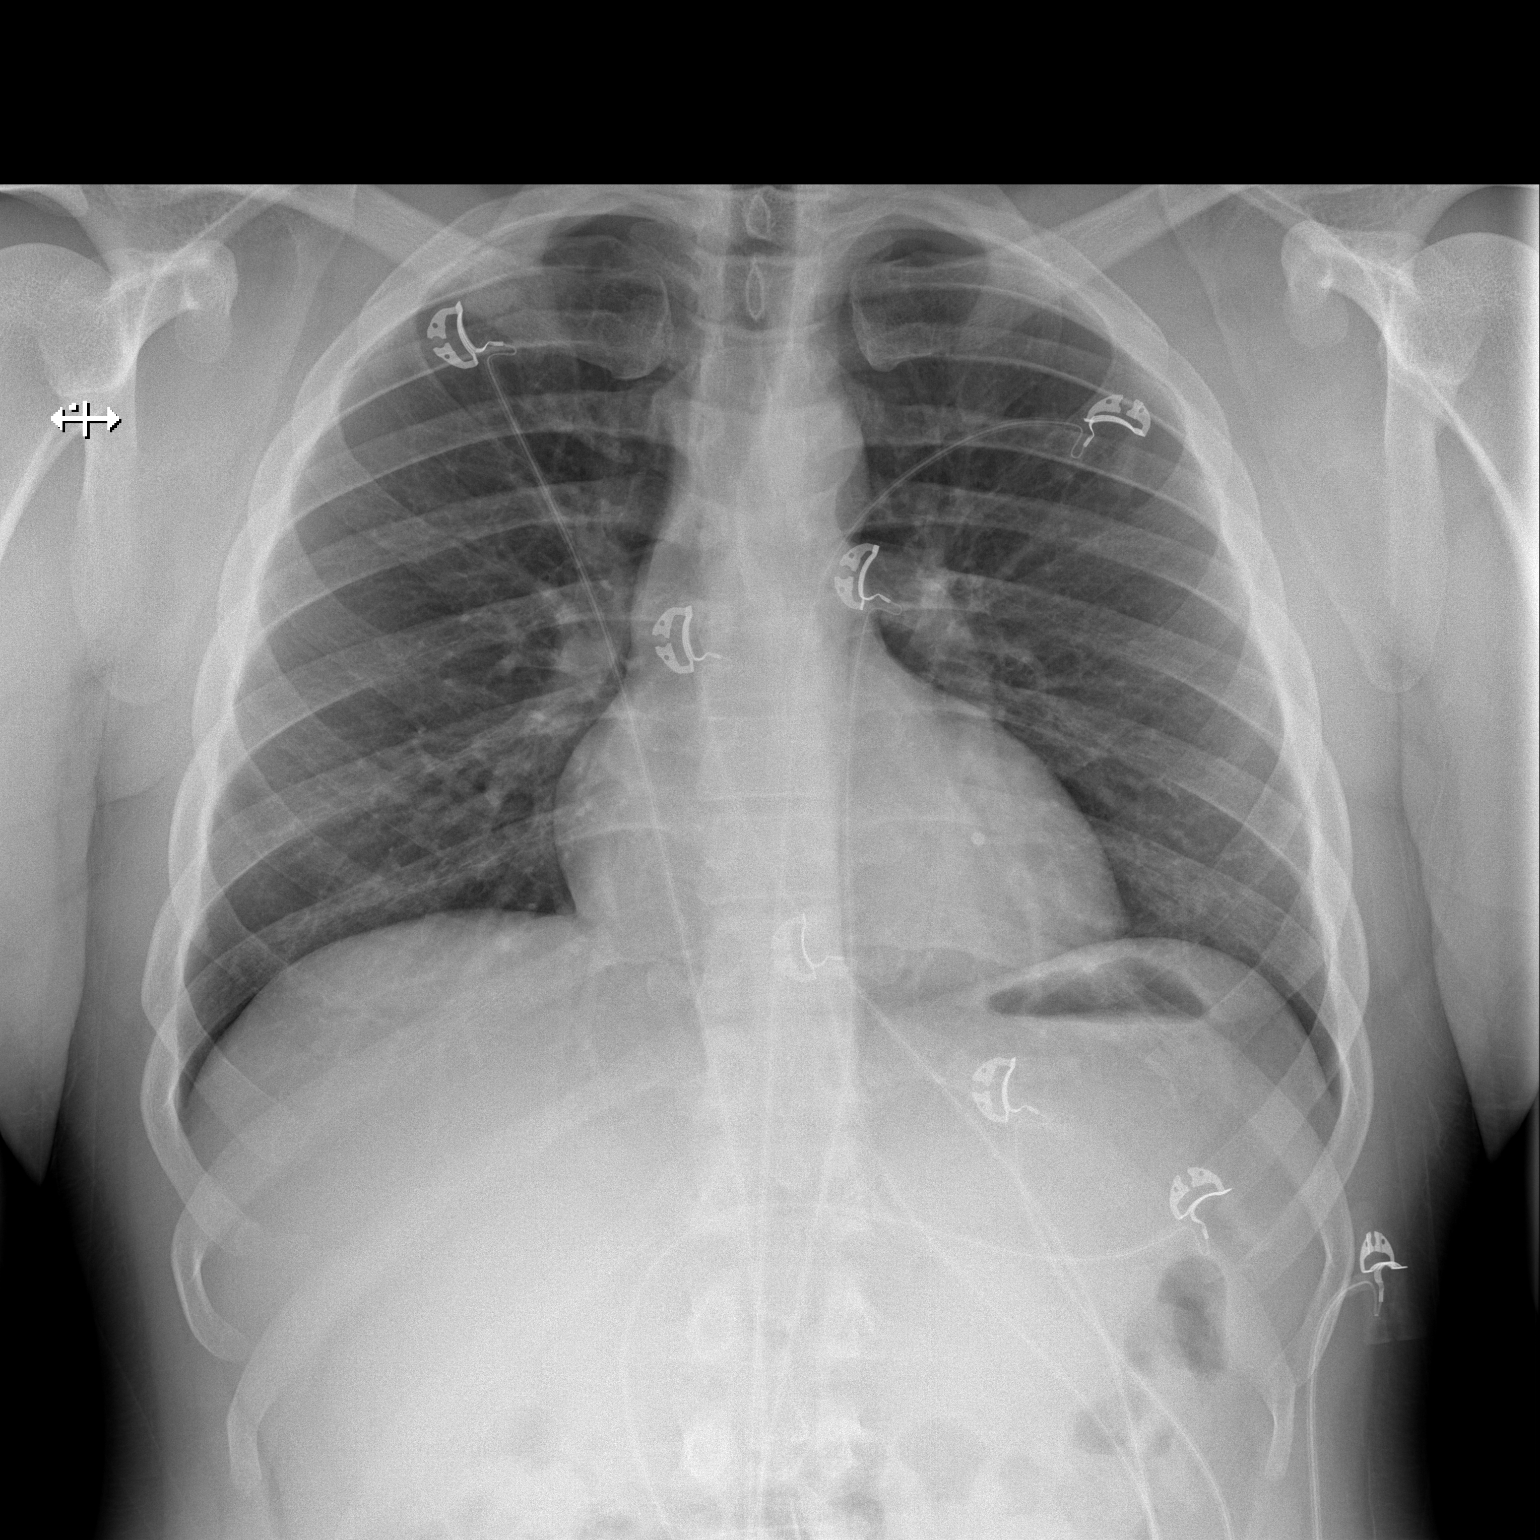

[w chest lat]
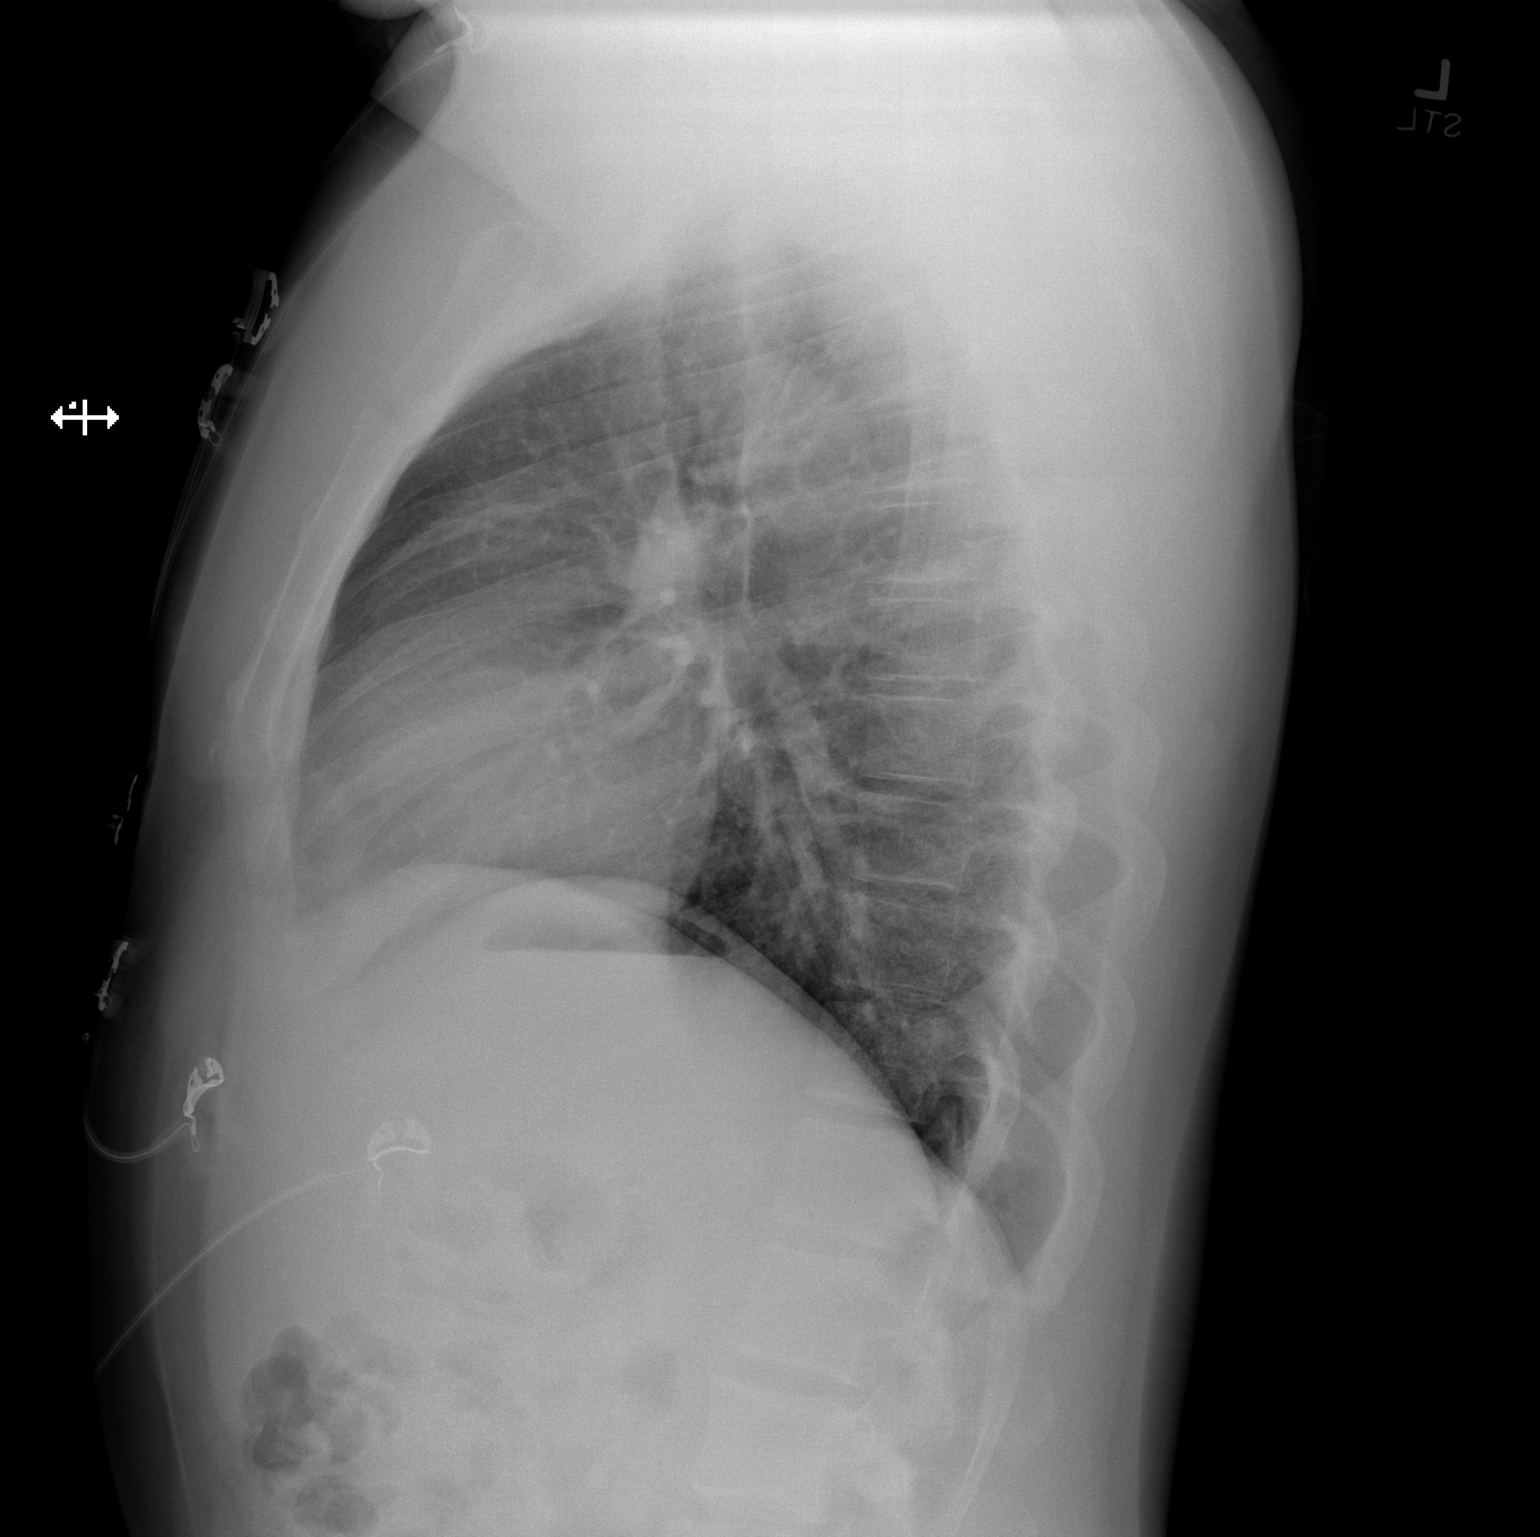

[2 of 2 positions shown; findings below may reference images not displayed]

FINDINGS: Normal heart size. Normal mediastinal contour. No pneumothorax. No
pleural effusion. Clear lungs, with no focal lung consolidation and
no pulmonary edema.
IMPRESSION: No active cardiopulmonary disease.

## 2016-09-17 ENCOUNTER — Encounter: Payer: Self-pay | Admitting: Gastroenterology

## 2016-09-24 ENCOUNTER — Ambulatory Visit: Payer: Managed Care, Other (non HMO) | Admitting: Gastroenterology

## 2016-12-17 ENCOUNTER — Ambulatory Visit (INDEPENDENT_AMBULATORY_CARE_PROVIDER_SITE_OTHER): Payer: Managed Care, Other (non HMO) | Admitting: Neurology

## 2016-12-17 ENCOUNTER — Encounter: Payer: Self-pay | Admitting: Neurology

## 2016-12-17 VITALS — BP 144/90 | HR 63 | Ht 71.0 in | Wt 250.0 lb

## 2016-12-17 DIAGNOSIS — J45909 Unspecified asthma, uncomplicated: Secondary | ICD-10-CM | POA: Diagnosis not present

## 2016-12-17 DIAGNOSIS — G4719 Other hypersomnia: Secondary | ICD-10-CM | POA: Diagnosis not present

## 2016-12-17 DIAGNOSIS — E669 Obesity, unspecified: Secondary | ICD-10-CM

## 2016-12-17 DIAGNOSIS — R0683 Snoring: Secondary | ICD-10-CM | POA: Diagnosis not present

## 2016-12-17 DIAGNOSIS — R0681 Apnea, not elsewhere classified: Secondary | ICD-10-CM

## 2016-12-17 NOTE — Progress Notes (Signed)
Subjective:    Patient ID: Eric Coleman is a 31 y.o. male.  HPI     Eric FoleySaima Chayce Rullo, MD, PhD [facility] [address], [address] [address] [place], [place] [id number]  Dear Gavin Poundeborah,   I saw your patient, Eric Coleman, upon your kind request of neurologic clinic today for initial consultation of his sleep disorder, in particular, concern for underlying obstructive sleep apnea. The patient is unaccompanied today. As you know, Eric Coleman is a 31 year old right-handed gentleman with an underlying medical history of asthma, hyperlipidemia, and obesity, who reports snoring and excessive daytime somnolence. I reviewed your office note from 05/22/2016 as well as 09/16/2016. He was treated for an upper respiratory infection with amoxicillin. His weight has been stable. He does endorse taking a nap at times on weekends for example. His Epworth sleepiness score is 13 out of 24, fatigue score is 44 out of 63. He is not aware of any family history of OSA. His wife has witnessed apneas while he is asleep. This has been going on perhaps for 2 years he believes. 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 He has 2 children, 32-year-old daughter and 32-year-old son. He watches TV in bed but turns it off before falling asleep. Bedtime is around 10 and wake up time around 6. He does not have night to night nocturia or telltale restless leg symptoms or morning headaches typically. He is a nonsmoker and drinks alcohol socially, approximately once a week in the form of beer, he does drink quite a bit of caffeine in the form of coffee, 3-4 cups estimated daily during the workweek and also 2 cups of tea per day on average. He does not typically drink sodas.  His Past Medical History Is Significant For: Past Medical History:  Diagnosis Date  . Anxiety   . Asthma   . BRBPR (bright red blood per rectum)   . High cholesterol   . Hyperlipidemia   . URI (upper respiratory infection)      His Past Surgical History Is Significant For: Past Surgical History:  Procedure Laterality Date  . VASECTOMY    . WISDOM TOOTH EXTRACTION      His Family History Is Significant For: Family History  Problem Relation Age of Onset  . Diabetes Mother   . Depression Mother   . Depression Father   . Hypertension Father   . Diabetes Father   . Cancer Maternal Grandmother     His Social History Is Significant For: Social History   Social History  . Marital status: Married    Spouse name: N/A  . Number of children: N/A  . Years of education: N/A   Social History Main Topics  . Smoking status: Never Smoker  . Smokeless tobacco: Never Used  . Alcohol use Yes     Comment: occasional   . Drug use: Unknown  . Sexual activity: Not Asked   Other Topics Concern  . None   Social History Narrative  . None    His Allergies Are:  Allergies  Allergen Reactions  . Soy Allergy     Unknown reaction   :   His Current Medications Are:  Outpatient Encounter Prescriptions as of 12/17/2016  Medication Sig  . albuterol (PROVENTIL HFA;VENTOLIN HFA) 108 (90 BASE) MCG/ACT inhaler Inhale 2 puffs into the lungs every 6 (six) hours as needed for wheezing or shortness of breath.  . beclomethasone (QVAR) 80 MCG/ACT inhaler Inhale 1 puff into the lungs 2 (  two) times daily.  Marland Kitchen ibuprofen (ADVIL,MOTRIN) 200 MG tablet Take 200 mg by mouth every 6 (six) hours as needed for fever or moderate pain.  Marland Kitchen venlafaxine XR (EFFEXOR-XR) 150 MG 24 hr capsule Take 150 mg by mouth daily with breakfast.  . venlafaxine XR (EFFEXOR-XR) 75 MG 24 hr capsule Take 75 mg by mouth daily.   No facility-administered encounter medications on file as of 12/17/2016.   :  Review of Systems:  Out of a complete 14 point review of systems, all are reviewed and negative with the exception of these symptoms as listed below:  Review of Systems  Neurological:       Pt presents today to discuss his sleep. Pt has never had a  sleep study but does endorse snoring.  Epworth Sleepiness Scale 0= would never doze 1= slight chance of dozing 2= moderate chance of dozing 3= high chance of dozing  Sitting and reading: 1 Watching TV: 2 Sitting inactive in a public place (ex. Theater or meeting): 2 As a passenger in a car for an hour without a break: 1 Lying down to rest in the afternoon: 3 Sitting and talking to someone: 1 Sitting quietly after lunch (no alcohol): 3 In a car, while stopped in traffic: 0 Total: 13     Objective:  Neurological Exam  Physical Exam Physical Examination:   Vitals:   12/17/16 1541  BP: (!) 144/90  Pulse: 63   General Examination: The patient is a very pleasant 31 y.o. male in no acute distress. He appears well-developed and well-nourished and adequately groomed.   HEENT: Normocephalic, atraumatic, pupils are equal, round and reactive to light and accommodation. Extraocular tracking is good without limitation to gaze excursion or nystagmus noted. Normal smooth pursuit is noted. Hearing is grossly intact. Face is symmetric with normal facial animation and normal facial sensation. Speech is clear with no dysarthria noted. There is no hypophonia. There is no lip, neck/head, jaw or voice tremor. Neck is supple with full range of passive and active motion. There are no carotid bruits on auscultation. Oropharynx exam reveals: mild mouth dryness, adequate dental hygiene and moderate airway crowding, due to larger tongue, redundant soft palate, thicker uvula, tonsils in place. Mallampati is class II. Tongue protrudes centrally and palate elevates symmetrically. Tonsils are 1+ in size. Neck size is 19.25 inches. He has a Mild overbite. Nasal inspection reveals no significant nasal mucosal bogginess or redness and no septal deviation, but inf turb hypertrophy.   Chest: Clear to auscultation without wheezing, rhonchi or crackles noted.  Heart: S1+S2+0, regular and normal without murmurs, rubs or  gallops noted.   Abdomen: Soft, non-tender and non-distended with normal bowel sounds appreciated on auscultation.  Extremities: There is no pitting edema in the distal lower extremities bilaterally. Pedal pulses are intact.  Skin: Warm and dry without trophic changes noted.  Musculoskeletal: exam reveals no obvious joint deformities, tenderness or joint swelling or erythema.   Neurologically:  Mental status: The patient is awake, alert and oriented in all 4 spheres. His immediate and remote memory, attention, language skills and fund of knowledge are appropriate. There is no evidence of aphasia, agnosia, apraxia or anomia. Speech is clear with normal prosody and enunciation. Thought process is linear. Mood is normal and affect is normal.  Cranial nerves II - XII are as described above under HEENT exam. In addition: shoulder shrug is normal with equal shoulder height noted. Motor exam: Normal bulk, strength and tone is noted. There is no  drift, tremor or rebound. Romberg is negative. Reflexes are 1-2+ throughout. Fine motor skills and coordination: intact with normal finger taps, normal hand movements, normal rapid alternating patting, normal foot taps and normal foot agility.  Cerebellar testing: No dysmetria or intention tremor on finger to nose testing. Heel to shin is unremarkable bilaterally. There is no truncal or gait ataxia.  Sensory exam: intact to light touch in the upper and lower extremities.  Gait, station and balance: He stands easily. No veering to one side is noted. No leaning to one side is noted. Posture is age-appropriate and stance is narrow based. Gait shows normal stride length and normal pace. No problems turning are noted. Tandem walk is unremarkable.   Assessment and Plan:   In summary, Eric Coleman is a very pleasant 31 y.o.-year old male with an underlying medical history of asthma, hyperlipidemia, and obesity, whose history and physical exam are concerning for  obstructive sleep apnea (OSA). I had a long chat with the patient about my findings and the diagnosis of OSA, its prognosis and treatment options. We talked about medical treatments, surgical interventions and non-pharmacological approaches. I explained in particular the risks and ramifications of untreated moderate to severe OSA, especially with respect to developing cardiovascular disease down the Road, including congestive heart failure, difficult to treat hypertension, cardiac arrhythmias, or stroke. Even type 2 diabetes has, in part, been linked to untreated OSA. Symptoms of untreated OSA include daytime sleepiness, memory problems, mood irritability and mood disorder such as depression and anxiety, lack of energy, as well as recurrent headaches, especially morning headaches. We talked about trying to maintain a healthy lifestyle in general, as well as the importance of weight control. I encouraged the patient to eat healthy, exercise daily and keep well hydrated, to keep a scheduled bedtime and wake time routine, to not skip any meals and eat healthy snacks in between meals. I advised the patient not to drive when feeling sleepy. I recommended the following at this time: sleep study with potential positive airway pressure titration. (We will score hypopneas at 4%).   I explained the sleep test procedure to the patient and also outlined possible surgical and non-surgical treatment options of OSA, including the use of a custom-made dental device (which would require a referral to a specialist dentist or oral surgeon), upper airway surgical options, such as pillar implants, radiofrequency surgery, tongue base surgery, and UPPP (which would involve a referral to an ENT surgeon). Rarely, jaw surgery such as mandibular advancement may be considered.  I also explained the CPAP treatment option to the patient, who indicated that he would be willing to try CPAP if the need arises. I explained the importance of  being compliant with PAP treatment, not only for insurance purposes but primarily to improve His symptoms, and for the patient's long term health benefit, including to reduce His cardiovascular risks. I answered all his questions today and the patient was in agreement. I would like to see him back after the sleep study is completed and encouraged him to call with any interim questions, concerns, problems or updates.   Thank you very much for allowing me to participate in the care of this nice patient. If I can be of any further assistance to you please do not hesitate to call me at [phone].  Sincerely,   Eric FoleySaima Arick Mareno, MD, PhD

## 2016-12-17 NOTE — Patient Instructions (Signed)

## 2016-12-18 ENCOUNTER — Institutional Professional Consult (permissible substitution): Payer: Managed Care, Other (non HMO) | Admitting: Neurology

## 2017-01-06 ENCOUNTER — Institutional Professional Consult (permissible substitution): Payer: Managed Care, Other (non HMO) | Admitting: Neurology

## 2017-01-13 ENCOUNTER — Encounter: Payer: Self-pay | Admitting: Neurology

## 2017-03-24 ENCOUNTER — Telehealth: Payer: Self-pay | Admitting: Neurology

## 2017-03-24 NOTE — Telephone Encounter (Signed)
We have attempted to call the patient 2 times to schedule sleep study. Patient has been unavailable at the phone numbers we have on file and has not returned our calls. At this point we will send a letter asking pt to please contact the sleep lab to schedule their sleep study. If patient calls back we will schedule them for their sleep study. ° °

## 2017-05-26 ENCOUNTER — Emergency Department
Admission: EM | Admit: 2017-05-26 | Discharge: 2017-05-26 | Disposition: A | Discharge status: Home / Self Care | Payer: Self-pay | Source: Ambulatory Visit | Attending: Emergency Medicine | Admitting: Emergency Medicine

## 2017-05-26 ENCOUNTER — Emergency Department: Payer: Self-pay

## 2017-05-26 ENCOUNTER — Encounter: Payer: Self-pay | Admitting: Emergency Medical Services

## 2017-05-26 DIAGNOSIS — S43005A Unspecified dislocation of left shoulder joint, initial encounter: Secondary | ICD-10-CM

## 2017-05-26 DIAGNOSIS — Y9289 Other specified places as the place of occurrence of the external cause: Secondary | ICD-10-CM

## 2017-05-26 DIAGNOSIS — M25512 Pain in left shoulder: Secondary | ICD-10-CM | POA: Insufficient documentation

## 2017-05-26 DIAGNOSIS — X509XXA Other and unspecified overexertion or strenuous movements or postures, initial encounter: Secondary | ICD-10-CM

## 2017-05-26 LAB — HM HIV SCREENING OFFERED
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
## 2017-05-26 NOTE — ED Notes (Signed)
Pt states he was able to place his shoulder back in place,

## 2017-05-26 NOTE — Discharge Instructions (Signed)
You were seen in the Emergency Department after you dislocated your shoulder earlier today. Your shoulder is now back in place on the x-ray. You should follow up with your orthopedic doctor to discuss any further treatment.     Return to the Emergency Department if you experience any new or concerning symptoms such as worsening shoulder pain, deformity, numbness, tingling.

## 2017-05-26 NOTE — ED Triage Notes (Signed)
Pt to ed with c/o left shoulder injury s/p fall, hx of dislocation, decreased ROM       Triage Note   Lazarus Gowdaennis S Merelin Human, RN

## 2018-04-06 ENCOUNTER — Emergency Department (HOSPITAL_COMMUNITY)
Admission: EM | Admit: 2018-04-06 | Discharge: 2018-04-06 | Disposition: A | Discharge status: Home / Self Care | Payer: BLUE CROSS/BLUE SHIELD | Attending: Emergency Medicine | Admitting: Emergency Medicine

## 2018-04-06 ENCOUNTER — Other Ambulatory Visit: Payer: Self-pay

## 2018-04-06 ENCOUNTER — Emergency Department (HOSPITAL_COMMUNITY): Payer: BLUE CROSS/BLUE SHIELD

## 2018-04-06 DIAGNOSIS — N50819 Testicular pain, unspecified: Secondary | ICD-10-CM

## 2018-04-06 DIAGNOSIS — Z79899 Other long term (current) drug therapy: Secondary | ICD-10-CM | POA: Insufficient documentation

## 2018-04-06 DIAGNOSIS — N50811 Right testicular pain: Secondary | ICD-10-CM | POA: Diagnosis present

## 2018-04-06 DIAGNOSIS — I861 Scrotal varices: Secondary | ICD-10-CM | POA: Insufficient documentation

## 2018-04-06 DIAGNOSIS — J45909 Unspecified asthma, uncomplicated: Secondary | ICD-10-CM | POA: Insufficient documentation

## 2018-04-06 LAB — URINALYSIS, ROUTINE W REFLEX MICROSCOPIC
Bacteria, UA: NONE SEEN
Bilirubin Urine: NEGATIVE
Glucose, UA: NEGATIVE mg/dL
Hgb urine dipstick: NEGATIVE
Ketones, ur: NEGATIVE mg/dL
Leukocytes, UA: NEGATIVE
Nitrite: NEGATIVE
Protein, ur: NEGATIVE mg/dL
Specific Gravity, Urine: 1.016 (ref 1.005–1.030)
pH: 5 (ref 5.0–8.0)

## 2018-04-06 NOTE — Discharge Instructions (Addendum)
Please read attached information. If you experience any new or worsening signs or symptoms please return to the emergency room for evaluation. Please follow-up with your primary care provider or specialist as discussed.  °

## 2018-04-06 NOTE — ED Triage Notes (Signed)
Pt arrives via POV from home with right testicular pain for the last month and a half. Reports pain is worse with ejaculation. Completed antibiotic for epididymitis but no improvement in symptoms. Denies recent fever.

## 2018-04-06 NOTE — ED Provider Notes (Signed)
[facility] Provider Note   CSN: [id number] Arrival date & time: 04/06/18  0757     History   Chief Complaint Chief Complaint  Patient presents with  . Groin Swelling    HPI Anson FretChristopher Birenbaum is a 32 y.o. male.  HPI   32 year old male presents today with complaints of right testicular pain and swelling.  Patient is proximate month ago he had pain in his right testicle.  Patient notes pain with ejaculation.  He denies any dysuria, discharge or bleeding.  He notes the pain and swelling is on the posterior aspect of the testicle.  He was seen at his primary care provider's office and diagnosed with epididymitis after physical exam and sent home with Levaquin.  Patient notes symptoms have not improved.  Patient denies any abdominal pain nausea vomiting or fever.  Sexually active with one male partner only.  Past Medical History:  Diagnosis Date  . Anxiety   . Asthma   . BRBPR (bright red blood per rectum)   . High cholesterol   . Hyperlipidemia   . URI (upper respiratory infection)     There are no active problems to display for this patient.   Past Surgical History:  Procedure Laterality Date  . VASECTOMY    . WISDOM TOOTH EXTRACTION          Home Medications    Prior to Admission medications   Medication Sig Start Date End Date Taking? Authorizing Provider  albuterol (PROVENTIL HFA;VENTOLIN HFA) 108 (90 BASE) MCG/ACT inhaler Inhale 2 puffs into the lungs every 6 (six) hours as needed for wheezing or shortness of breath.    [provider]  beclomethasone (QVAR) 80 MCG/ACT inhaler Inhale 1 puff into the lungs 2 (two) times daily.    [provider]  ibuprofen (ADVIL,MOTRIN) 200 MG tablet Take 200 mg by mouth every 6 (six) hours as needed for fever or moderate pain.    [provider]  venlafaxine XR (EFFEXOR-XR) 150 MG 24 hr capsule Take 150 mg by mouth daily with breakfast.    [provider]  venlafaxine XR (EFFEXOR-XR) 75 MG 24 hr capsule Take 75 mg by mouth daily.    [provider]    Family History Family History  Problem Relation Age of Onset  . Diabetes Mother   . Depression Mother   . Depression Father   . Hypertension Father   . Diabetes Father   . Cancer Maternal Grandmother     Social History Social History   Tobacco Use  . Smoking status: Never Smoker  . Smokeless tobacco: Never Used  Substance Use Topics  . Alcohol use: Yes    Comment: occasional   . Drug use: Not on file     Allergies   Soy allergy   Review of Systems Review of Systems  All other systems reviewed and are negative.    Physical Exam Updated Vital Signs BP (!) 115/91 (BP Location: Right Arm)   Pulse (!) 49   Temp 98.3 F (36.8 C) (Oral)   Resp 16   Ht 5\' 11"  (1.803 m)   Wt 99.8 kg   SpO2 98%   BMI 30.68 kg/m   Physical Exam  Constitutional: He is oriented to person, place, and time. He appears well-developed and well-nourished.  HENT:  Head: Normocephalic and atraumatic.  Eyes: Pupils are equal, round, and reactive to light. Conjunctivae are normal. Right eye exhibits no discharge. Left eye exhibits no discharge.  No scleral icterus.  Neck: Normal range of motion. No JVD present. No tracheal deviation present.  Pulmonary/Chest: Effort normal. No stridor.  Abdominal: Soft. He exhibits no distension. There is no tenderness.  Genitourinary:  Genitourinary Comments: Bilateral descended testicles no masses noted, minimal tenderness along the posterior right testicle-no penile discharge  Neurological: He is alert and oriented to person, place, and time. Coordination normal.  Psychiatric: He has a normal mood and affect. His behavior is normal. Judgment and thought content normal.  Nursing note and vitals reviewed.    ED Treatments / Results  Labs (all labs ordered are listed, but only abnormal results are displayed) Labs Reviewed  URINALYSIS, ROUTINE W  REFLEX MICROSCOPIC  GC/CHLAMYDIA PROBE AMP ([place]) NOT AT [facility]    EKG None  Radiology US Scrotum W/doppler  Result Date: 04/06/2018 CLINICAL DATA:  32 year old male with right testicular pain and swelling for the past 2 weeks. History of prior vasectomy. EXAM: SCROTAL ULTRASOUND DOPPLER ULTRASOUND OF THE TESTICLES TECHNIQUE: Complete ultrasound examination of the testicles, epididymis, and other scrotal structures was performed. Color and spectral Doppler ultrasound were also utilized to evaluate blood flow to the testicles. COMPARISON:  None. FINDINGS: Right testicle Measurements: 4.8 x 2.7 x 4.1 cm. No mass or microlithiasis visualized. Left testicle Measurements: 4.3 x 2.6 x 2.9 cm. No mass or microlithiasis visualized. Right epididymis:  Normal in size and appearance. Left epididymis:  Normal in size and appearance. Hydrocele:  None visualized. Varicocele:  Small left-sided varicocele. Pulsed Doppler interrogation of both testes demonstrates normal low resistance arterial and venous waveforms bilaterally. IMPRESSION: 1. Negative for evidence of testicular torsion at this time. 2. Small amount of scrotal fluid bilaterally is likely physiologic. 3. Small left-sided varicocele noted incidentally. Electronically Signed   By: Malachy Moan M.D.   On: 04/06/2018 09:45    Procedures Procedures (including critical care time)  Medications Ordered in ED Medications - No data to display   Initial Impression / Assessment and Plan / ED Course  I have reviewed the triage vital signs and the nursing notes.  Pertinent labs & imaging results that were available during my care of the patient were reviewed by me and considered in my medical decision making (see chart for details).     32 year old male presents today with complaints of testicular pain.  Patient notes swelling to the right testicle, I cannot appreciate a mass on the testicle ultrasound reassuring no acute abnormalities noted.   Urine clear, low suspicion for infectious etiology.  GC sent, patient encouraged follow-up as an outpatient with urology.  Strict return precautions given.  Verbalized understanding and agreement to today's plan.  Final Clinical Impressions(s) / ED Diagnoses   Final diagnoses:  Testicular pain    ED Discharge Orders    None       Eyvonne Mechanic, PA-C 04/06/18 1451    Alvira Monday, MD 04/09/18 1114

## 2018-04-07 LAB — GC/CHLAMYDIA PROBE AMP (~~LOC~~) NOT AT ARMC
Chlamydia: NEGATIVE
Neisseria Gonorrhea: NEGATIVE
